# Patient Record
Sex: Male | Born: 1945
Health system: Southern US, Community
[De-identification: ages and names within clinical notes are randomized; demographics above are authoritative.]

## PROBLEM LIST (undated history)

## (undated) DIAGNOSIS — I10 Essential (primary) hypertension: Secondary | ICD-10-CM

## (undated) DIAGNOSIS — Z85828 Personal history of other malignant neoplasm of skin: Secondary | ICD-10-CM

## (undated) DIAGNOSIS — E119 Type 2 diabetes mellitus without complications: Secondary | ICD-10-CM

## (undated) DIAGNOSIS — K589 Irritable bowel syndrome without diarrhea: Secondary | ICD-10-CM

## (undated) DIAGNOSIS — K219 Gastro-esophageal reflux disease without esophagitis: Secondary | ICD-10-CM

## (undated) DIAGNOSIS — E785 Hyperlipidemia, unspecified: Secondary | ICD-10-CM

## (undated) HISTORY — DX: Irritable bowel syndrome, unspecified: K58.9

## (undated) HISTORY — DX: Gastro-esophageal reflux disease without esophagitis: K21.9

## (undated) HISTORY — DX: Essential (primary) hypertension: I10

## (undated) HISTORY — DX: Hyperlipidemia, unspecified: E78.5

## (undated) HISTORY — DX: Personal history of other malignant neoplasm of skin: Z85.828

## (undated) HISTORY — DX: Type 2 diabetes mellitus without complications: E11.9

---

## 2004-10-27 ENCOUNTER — Ambulatory Visit: Payer: Self-pay | Admitting: Family Medicine

## 2005-01-24 ENCOUNTER — Ambulatory Visit: Payer: Self-pay | Admitting: Family Medicine

## 2005-07-19 ENCOUNTER — Ambulatory Visit: Payer: Self-pay | Admitting: Family Medicine

## 2005-08-03 ENCOUNTER — Ambulatory Visit: Payer: Self-pay | Admitting: Family Medicine

## 2005-10-12 ENCOUNTER — Ambulatory Visit: Payer: Self-pay | Admitting: Family Medicine

## 2005-12-13 ENCOUNTER — Ambulatory Visit: Payer: Self-pay | Admitting: Family Medicine

## 2005-12-29 ENCOUNTER — Ambulatory Visit: Payer: Self-pay | Admitting: Family Medicine

## 2011-02-09 HISTORY — PX: ESOPHAGOGASTRODUODENOSCOPY: SHX1529

## 2015-12-24 DIAGNOSIS — E119 Type 2 diabetes mellitus without complications: Secondary | ICD-10-CM | POA: Diagnosis not present

## 2015-12-24 DIAGNOSIS — E784 Other hyperlipidemia: Secondary | ICD-10-CM | POA: Diagnosis not present

## 2015-12-24 DIAGNOSIS — I10 Essential (primary) hypertension: Secondary | ICD-10-CM | POA: Diagnosis not present

## 2016-01-05 DIAGNOSIS — Z1211 Encounter for screening for malignant neoplasm of colon: Secondary | ICD-10-CM | POA: Diagnosis not present

## 2016-01-05 DIAGNOSIS — G8929 Other chronic pain: Secondary | ICD-10-CM | POA: Diagnosis not present

## 2016-01-05 DIAGNOSIS — Z86010 Personal history of colonic polyps: Secondary | ICD-10-CM | POA: Diagnosis not present

## 2016-01-05 DIAGNOSIS — R1031 Right lower quadrant pain: Secondary | ICD-10-CM | POA: Diagnosis not present

## 2016-01-08 DIAGNOSIS — Z1211 Encounter for screening for malignant neoplasm of colon: Secondary | ICD-10-CM | POA: Diagnosis not present

## 2016-01-08 DIAGNOSIS — D124 Benign neoplasm of descending colon: Secondary | ICD-10-CM | POA: Diagnosis not present

## 2016-01-08 DIAGNOSIS — Z86010 Personal history of colonic polyps: Secondary | ICD-10-CM | POA: Diagnosis not present

## 2016-01-08 DIAGNOSIS — K648 Other hemorrhoids: Secondary | ICD-10-CM | POA: Diagnosis not present

## 2016-01-08 DIAGNOSIS — K5730 Diverticulosis of large intestine without perforation or abscess without bleeding: Secondary | ICD-10-CM | POA: Diagnosis not present

## 2016-01-08 HISTORY — PX: COLONOSCOPY: SHX174

## 2016-02-23 DIAGNOSIS — E782 Mixed hyperlipidemia: Secondary | ICD-10-CM | POA: Diagnosis not present

## 2016-03-24 DIAGNOSIS — I10 Essential (primary) hypertension: Secondary | ICD-10-CM | POA: Diagnosis not present

## 2016-03-24 DIAGNOSIS — E119 Type 2 diabetes mellitus without complications: Secondary | ICD-10-CM | POA: Diagnosis not present

## 2016-03-24 DIAGNOSIS — E782 Mixed hyperlipidemia: Secondary | ICD-10-CM | POA: Diagnosis not present

## 2016-04-06 DIAGNOSIS — E119 Type 2 diabetes mellitus without complications: Secondary | ICD-10-CM | POA: Diagnosis not present

## 2016-09-12 DIAGNOSIS — Z23 Encounter for immunization: Secondary | ICD-10-CM | POA: Diagnosis not present

## 2016-09-21 DIAGNOSIS — H2513 Age-related nuclear cataract, bilateral: Secondary | ICD-10-CM | POA: Diagnosis not present

## 2016-09-21 DIAGNOSIS — E119 Type 2 diabetes mellitus without complications: Secondary | ICD-10-CM | POA: Diagnosis not present

## 2016-09-21 DIAGNOSIS — H53002 Unspecified amblyopia, left eye: Secondary | ICD-10-CM | POA: Diagnosis not present

## 2016-09-29 DIAGNOSIS — E782 Mixed hyperlipidemia: Secondary | ICD-10-CM | POA: Diagnosis not present

## 2016-09-29 DIAGNOSIS — E119 Type 2 diabetes mellitus without complications: Secondary | ICD-10-CM | POA: Diagnosis not present

## 2016-09-29 DIAGNOSIS — R011 Cardiac murmur, unspecified: Secondary | ICD-10-CM | POA: Diagnosis not present

## 2016-09-29 DIAGNOSIS — I10 Essential (primary) hypertension: Secondary | ICD-10-CM | POA: Diagnosis not present

## 2016-11-23 DIAGNOSIS — J042 Acute laryngotracheitis: Secondary | ICD-10-CM | POA: Diagnosis not present

## 2017-02-02 DIAGNOSIS — M792 Neuralgia and neuritis, unspecified: Secondary | ICD-10-CM | POA: Diagnosis not present

## 2017-03-14 DIAGNOSIS — L570 Actinic keratosis: Secondary | ICD-10-CM | POA: Diagnosis not present

## 2017-03-14 DIAGNOSIS — L821 Other seborrheic keratosis: Secondary | ICD-10-CM | POA: Diagnosis not present

## 2017-03-14 DIAGNOSIS — L578 Other skin changes due to chronic exposure to nonionizing radiation: Secondary | ICD-10-CM | POA: Diagnosis not present

## 2017-04-03 DIAGNOSIS — E119 Type 2 diabetes mellitus without complications: Secondary | ICD-10-CM | POA: Diagnosis not present

## 2017-04-03 DIAGNOSIS — E782 Mixed hyperlipidemia: Secondary | ICD-10-CM | POA: Diagnosis not present

## 2017-04-03 DIAGNOSIS — Z7984 Long term (current) use of oral hypoglycemic drugs: Secondary | ICD-10-CM | POA: Diagnosis not present

## 2017-04-13 DIAGNOSIS — E119 Type 2 diabetes mellitus without complications: Secondary | ICD-10-CM | POA: Diagnosis not present

## 2017-04-13 DIAGNOSIS — I10 Essential (primary) hypertension: Secondary | ICD-10-CM | POA: Diagnosis not present

## 2017-04-13 DIAGNOSIS — E782 Mixed hyperlipidemia: Secondary | ICD-10-CM | POA: Diagnosis not present

## 2017-07-10 DIAGNOSIS — H25813 Combined forms of age-related cataract, bilateral: Secondary | ICD-10-CM | POA: Diagnosis not present

## 2017-07-10 DIAGNOSIS — E119 Type 2 diabetes mellitus without complications: Secondary | ICD-10-CM | POA: Diagnosis not present

## 2017-07-12 DIAGNOSIS — L570 Actinic keratosis: Secondary | ICD-10-CM | POA: Diagnosis not present

## 2017-07-12 DIAGNOSIS — L821 Other seborrheic keratosis: Secondary | ICD-10-CM | POA: Diagnosis not present

## 2017-07-14 DIAGNOSIS — E119 Type 2 diabetes mellitus without complications: Secondary | ICD-10-CM | POA: Diagnosis not present

## 2017-09-03 DIAGNOSIS — R739 Hyperglycemia, unspecified: Secondary | ICD-10-CM | POA: Diagnosis not present

## 2017-09-03 DIAGNOSIS — R531 Weakness: Secondary | ICD-10-CM | POA: Diagnosis not present

## 2017-09-03 DIAGNOSIS — E1165 Type 2 diabetes mellitus with hyperglycemia: Secondary | ICD-10-CM | POA: Diagnosis not present

## 2017-09-03 DIAGNOSIS — R42 Dizziness and giddiness: Secondary | ICD-10-CM | POA: Diagnosis not present

## 2017-09-08 DIAGNOSIS — F419 Anxiety disorder, unspecified: Secondary | ICD-10-CM | POA: Diagnosis not present

## 2017-09-08 DIAGNOSIS — Z23 Encounter for immunization: Secondary | ICD-10-CM | POA: Diagnosis not present

## 2017-10-19 DIAGNOSIS — E119 Type 2 diabetes mellitus without complications: Secondary | ICD-10-CM | POA: Diagnosis not present

## 2017-10-19 DIAGNOSIS — F419 Anxiety disorder, unspecified: Secondary | ICD-10-CM | POA: Diagnosis not present

## 2017-10-19 DIAGNOSIS — E782 Mixed hyperlipidemia: Secondary | ICD-10-CM | POA: Diagnosis not present

## 2017-10-19 DIAGNOSIS — I10 Essential (primary) hypertension: Secondary | ICD-10-CM | POA: Diagnosis not present

## 2017-11-01 DIAGNOSIS — I10 Essential (primary) hypertension: Secondary | ICD-10-CM | POA: Diagnosis not present

## 2017-11-01 DIAGNOSIS — E119 Type 2 diabetes mellitus without complications: Secondary | ICD-10-CM | POA: Diagnosis not present

## 2017-11-01 DIAGNOSIS — S39011A Strain of muscle, fascia and tendon of abdomen, initial encounter: Secondary | ICD-10-CM | POA: Diagnosis not present

## 2017-11-01 DIAGNOSIS — R1032 Left lower quadrant pain: Secondary | ICD-10-CM | POA: Diagnosis not present

## 2017-11-17 DIAGNOSIS — K582 Mixed irritable bowel syndrome: Secondary | ICD-10-CM | POA: Diagnosis not present

## 2017-11-21 DIAGNOSIS — A09 Infectious gastroenteritis and colitis, unspecified: Secondary | ICD-10-CM | POA: Diagnosis not present

## 2017-12-04 DIAGNOSIS — J042 Acute laryngotracheitis: Secondary | ICD-10-CM | POA: Diagnosis not present

## 2017-12-05 HISTORY — PX: HERNIA REPAIR: SHX51

## 2017-12-13 DIAGNOSIS — L821 Other seborrheic keratosis: Secondary | ICD-10-CM | POA: Diagnosis not present

## 2017-12-13 DIAGNOSIS — L570 Actinic keratosis: Secondary | ICD-10-CM | POA: Diagnosis not present

## 2017-12-13 DIAGNOSIS — L578 Other skin changes due to chronic exposure to nonionizing radiation: Secondary | ICD-10-CM | POA: Diagnosis not present

## 2017-12-20 DIAGNOSIS — E119 Type 2 diabetes mellitus without complications: Secondary | ICD-10-CM | POA: Diagnosis not present

## 2017-12-20 DIAGNOSIS — I10 Essential (primary) hypertension: Secondary | ICD-10-CM | POA: Diagnosis not present

## 2017-12-20 DIAGNOSIS — R1032 Left lower quadrant pain: Secondary | ICD-10-CM | POA: Diagnosis not present

## 2017-12-20 DIAGNOSIS — K4020 Bilateral inguinal hernia, without obstruction or gangrene, not specified as recurrent: Secondary | ICD-10-CM | POA: Diagnosis not present

## 2017-12-28 DIAGNOSIS — I493 Ventricular premature depolarization: Secondary | ICD-10-CM | POA: Diagnosis not present

## 2017-12-28 DIAGNOSIS — I4510 Unspecified right bundle-branch block: Secondary | ICD-10-CM | POA: Diagnosis not present

## 2017-12-28 DIAGNOSIS — Z01812 Encounter for preprocedural laboratory examination: Secondary | ICD-10-CM | POA: Diagnosis not present

## 2017-12-28 DIAGNOSIS — K4020 Bilateral inguinal hernia, without obstruction or gangrene, not specified as recurrent: Secondary | ICD-10-CM | POA: Diagnosis not present

## 2018-01-01 DIAGNOSIS — K4020 Bilateral inguinal hernia, without obstruction or gangrene, not specified as recurrent: Secondary | ICD-10-CM | POA: Diagnosis not present

## 2018-01-01 DIAGNOSIS — K4000 Bilateral inguinal hernia, with obstruction, without gangrene, not specified as recurrent: Secondary | ICD-10-CM | POA: Diagnosis not present

## 2018-01-01 DIAGNOSIS — E119 Type 2 diabetes mellitus without complications: Secondary | ICD-10-CM | POA: Diagnosis not present

## 2018-03-21 DIAGNOSIS — R1031 Right lower quadrant pain: Secondary | ICD-10-CM | POA: Diagnosis not present

## 2018-03-21 DIAGNOSIS — I10 Essential (primary) hypertension: Secondary | ICD-10-CM | POA: Diagnosis not present

## 2018-03-21 DIAGNOSIS — E119 Type 2 diabetes mellitus without complications: Secondary | ICD-10-CM | POA: Diagnosis not present

## 2018-04-25 DIAGNOSIS — E119 Type 2 diabetes mellitus without complications: Secondary | ICD-10-CM | POA: Diagnosis not present

## 2018-04-25 DIAGNOSIS — E782 Mixed hyperlipidemia: Secondary | ICD-10-CM | POA: Diagnosis not present

## 2018-04-25 DIAGNOSIS — F419 Anxiety disorder, unspecified: Secondary | ICD-10-CM | POA: Diagnosis not present

## 2018-04-25 DIAGNOSIS — I10 Essential (primary) hypertension: Secondary | ICD-10-CM | POA: Diagnosis not present

## 2018-04-25 DIAGNOSIS — Z0000 Encounter for general adult medical examination without abnormal findings: Secondary | ICD-10-CM | POA: Diagnosis not present

## 2018-04-25 DIAGNOSIS — Z125 Encounter for screening for malignant neoplasm of prostate: Secondary | ICD-10-CM | POA: Diagnosis not present

## 2018-07-17 ENCOUNTER — Encounter: Payer: Self-pay | Admitting: Gastroenterology

## 2018-08-20 ENCOUNTER — Encounter: Payer: Self-pay | Admitting: Gastroenterology

## 2018-08-21 ENCOUNTER — Ambulatory Visit (INDEPENDENT_AMBULATORY_CARE_PROVIDER_SITE_OTHER): Payer: PPO | Admitting: Gastroenterology

## 2018-08-21 ENCOUNTER — Encounter: Payer: Self-pay | Admitting: Gastroenterology

## 2018-08-21 VITALS — BP 138/82 | HR 82 | Ht 67.0 in | Wt 179.1 lb

## 2018-08-21 DIAGNOSIS — K582 Mixed irritable bowel syndrome: Secondary | ICD-10-CM

## 2018-08-21 DIAGNOSIS — R1011 Right upper quadrant pain: Secondary | ICD-10-CM | POA: Diagnosis not present

## 2018-08-21 NOTE — Patient Instructions (Signed)
If you are age 72 or older, your body mass index should be between 23-30. Your Body mass index is 28.05 kg/m. If this is out of the aforementioned range listed, please consider follow up with your Primary Care Provider.  If you are age 20 or younger, your body mass index should be between 19-25. Your Body mass index is 28.05 kg/m. If this is out of the aformentioned range listed, please consider follow up with your Primary Care Provider.    You have been scheduled for an abdominal ultrasound at [place] (1st floor of hospital) on 08/27/18 at 11am. Please arrive 15 minutes prior to your appointment for registration. Make certain not to have anything to eat or drink 6 hours prior to your appointment. Should you need to reschedule your appointment, please contact radiology at [phone]. This test typically takes about 30 minutes to perform.  Please purchase the following medications over the counter and take as directed: Benefiber  Stop taking Metamucil.   Thank you, Dr. Jackquline Denmark

## 2018-08-21 NOTE — Progress Notes (Signed)
Chief Complaint: FU  Referring Provider:  Dr Garlon Hatchet      ASSESSMENT AND PLAN;   #1. IBS with constipation (alt diarrhea), neg colon 01/2016 except for mild sigmoid diverticulosis, hyperplastic colonic polyp.  Repeat colonoscopy only if any new problems. -Switch metamucil to benefiber. -Increase water intake. -Patient is to call us if he still has any problems. -Can use Bentyl as needed if he has diarrhea.  #2.  RUQ pain, negative EGD 02/2011 with negative small bowel biopsies.  Status post recent robotic B/L inguinal hernia repair by Dr. Forestine Na. Nl labs in care everywhere. -Proceed with ultrasound of the abdomen complete. -CT scan of the abdomen and pelvis if the above work-up is negative and continued problems. -Patient will call us if he still has problems.  Follow-up if still with problems.  Discussed above with the patient's wife in detail as well.    HPI:    Jay Allen is a 72 y.o. male  S/p bilateral inguinal hernia repair jan 2019 Has done very well Has alternating constipation with occasional diarrhea And sticky stool.-With Metamucil At times will have diarrhea after eating dinner -once every 2 to 3 weeks. Has Bentyl but has not tried it. No melena or hematochezia Occasional right upper quadrant abdominal pain-could not specify as to if eating makes it worse.  No associated nausea, heartburn, regurgitation, odynophagia or dysphagia.  Has diabetes and is concerned about the liver and pancreas. No weight loss Please golf whenever he can.    Past Medical History:  Diagnosis Date  . Diabetes mellitus ([place])   . GERD (gastroesophageal reflux disease)   . HTN (hypertension)   . Hx of nonmelanoma skin cancer   . Hyperlipidemia   . IBS (irritable bowel syndrome)     Past Surgical History:  Procedure Laterality Date  . COLONOSCOPY  01/08/2016   Diminutive colon polyps, status post polypectomy.   . ESOPHAGOGASTRODUODENOSCOPY  02/09/2011   Mild gastritis.    Marland Kitchen HERNIA REPAIR  12/2017    History reviewed. No pertinent family history.  Social History   Tobacco Use  . Smoking status: Former Smoker    Last attempt to quit: 1982    Years since quitting: 37.7  . Smokeless tobacco: Never Used  Substance Use Topics  . Alcohol use: Yes    Alcohol/week: 4.0 standard drinks    Types: 4 Cans of beer per week    Frequency: Never  . Drug use: Never    Current Outpatient Medications  Medication Sig Dispense Refill  . atorvastatin (LIPITOR) 80 MG tablet Take 80 mg by mouth daily.    Marland Kitchen escitalopram (LEXAPRO) 10 MG tablet Take 10 mg by mouth daily.    . irbesartan-hydrochlorothiazide (AVALIDE) 300-12.5 MG tablet Take 1 tablet by mouth daily.     No current facility-administered medications for this visit.     Allergies  Allergen Reactions  . Amoxicillin Diarrhea  . Metformin Diarrhea    minor    Review of Systems:  Constitutional: Denies fever, chills, diaphoresis, appetite change and fatigue.  HEENT: Denies photophobia, eye pain, redness, hearing loss, ear pain, congestion, sore throat, rhinorrhea, sneezing, mouth sores, neck pain, neck stiffness and tinnitus.   Respiratory: Denies SOB, DOE, cough, chest tightness,  and wheezing.   Cardiovascular: Denies chest pain, palpitations and leg swelling.  Genitourinary: Denies dysuria, urgency, frequency, hematuria, flank pain and difficulty urinating.  Musculoskeletal: Denies myalgias, back pain, joint swelling, arthralgias and gait problem.  Skin: No rash.  Neurological: Denies dizziness, seizures, syncope, weakness, light-headedness, numbness and headaches.  Hematological: Denies adenopathy. Easy bruising, personal or family bleeding history  Psychiatric/Behavioral: No anxiety or depression     Physical Exam:    BP 138/82   Pulse 82   Ht 5\' 7"  (1.702 m)   Wt 179 lb 2 oz (81.3 kg)   BMI 28.05 kg/m  Filed Weights   08/21/18 1409  Weight: 179 lb 2 oz (81.3 kg)   Constitutional:   Well-developed, in no acute distress. Psychiatric: Normal mood and affect. Behavior is normal. HEENT: Pupils normal.  Conjunctivae are normal. No scleral icterus. Neck supple.  Cardiovascular: Normal rate, regular rhythm. No edema Pulmonary/chest: Effort normal and breath sounds normal. No wheezing, rales or rhonchi. Abdominal: Soft, nondistended. Nontender. Bowel sounds active throughout. There are no masses palpable. No hepatomegaly. Rectal:  defered Neurological: Alert and oriented to person place and time. Skin: Skin is warm and dry. No rashes noted.     Carmell Austria, MD 08/21/2018, 2:32 PM  Cc: Dr. Garlon Hatchet

## 2018-08-27 ENCOUNTER — Ambulatory Visit (HOSPITAL_BASED_OUTPATIENT_CLINIC_OR_DEPARTMENT_OTHER)
Admission: RE | Admit: 2018-08-27 | Discharge: 2018-08-27 | Disposition: A | Discharge status: Home / Self Care | Payer: PPO | Source: Ambulatory Visit | Attending: Gastroenterology | Admitting: Gastroenterology

## 2018-08-27 DIAGNOSIS — I700 Atherosclerosis of aorta: Secondary | ICD-10-CM | POA: Insufficient documentation

## 2018-08-27 DIAGNOSIS — R1011 Right upper quadrant pain: Secondary | ICD-10-CM

## 2018-08-27 DIAGNOSIS — N200 Calculus of kidney: Secondary | ICD-10-CM | POA: Insufficient documentation

## 2018-09-26 DIAGNOSIS — Z23 Encounter for immunization: Secondary | ICD-10-CM | POA: Diagnosis not present

## 2018-10-08 ENCOUNTER — Telehealth: Payer: Self-pay | Admitting: Gastroenterology

## 2018-10-08 NOTE — Telephone Encounter (Signed)
Discussed with pt that he can take the dicyclomine every 6 hours as needed, pts questions were answered.

## 2018-10-26 DIAGNOSIS — E119 Type 2 diabetes mellitus without complications: Secondary | ICD-10-CM | POA: Diagnosis not present

## 2018-10-26 DIAGNOSIS — F419 Anxiety disorder, unspecified: Secondary | ICD-10-CM | POA: Diagnosis not present

## 2018-10-26 DIAGNOSIS — E782 Mixed hyperlipidemia: Secondary | ICD-10-CM | POA: Diagnosis not present

## 2018-10-26 DIAGNOSIS — I700 Atherosclerosis of aorta: Secondary | ICD-10-CM | POA: Diagnosis not present

## 2018-10-26 DIAGNOSIS — I10 Essential (primary) hypertension: Secondary | ICD-10-CM | POA: Diagnosis not present

## 2018-10-26 DIAGNOSIS — Z125 Encounter for screening for malignant neoplasm of prostate: Secondary | ICD-10-CM | POA: Diagnosis not present

## 2018-12-02 ENCOUNTER — Other Ambulatory Visit: Payer: Self-pay | Admitting: Gastroenterology

## 2019-01-21 DIAGNOSIS — E119 Type 2 diabetes mellitus without complications: Secondary | ICD-10-CM | POA: Diagnosis not present

## 2019-01-21 DIAGNOSIS — H524 Presbyopia: Secondary | ICD-10-CM | POA: Diagnosis not present

## 2019-01-29 DIAGNOSIS — E782 Mixed hyperlipidemia: Secondary | ICD-10-CM | POA: Diagnosis not present

## 2019-03-02 ENCOUNTER — Other Ambulatory Visit: Payer: Self-pay | Admitting: Gastroenterology

## 2019-04-06 IMAGING — US US ABDOMEN COMPLETE
1 series · 13 of 25 positions shown · non-contrast
Comparison: 11/22/2013 CT. 12/15/2010 right upper quadrant
ultrasound.

CLINICAL DATA: 72-year-old male with intermittent right upper
quadrant pain over the past year. Initial encounter.

EXAM:
ABDOMEN ULTRASOUND COMPLETE

[Series 1: us abdomen complete · 0.13mm/px · 13 of 111 slices shown]
[im 1/111]
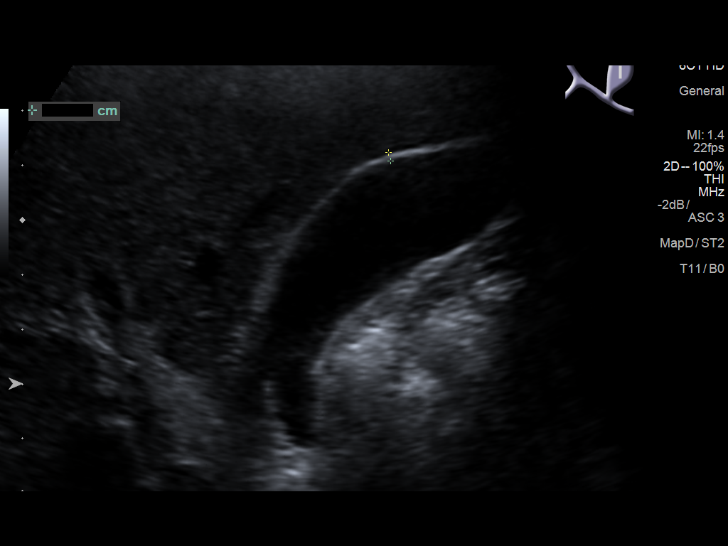
[im 10/111]
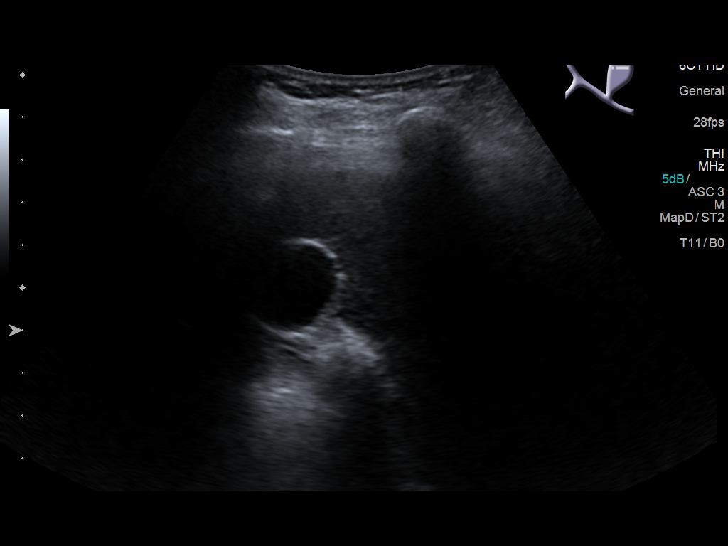
[im 19/111]
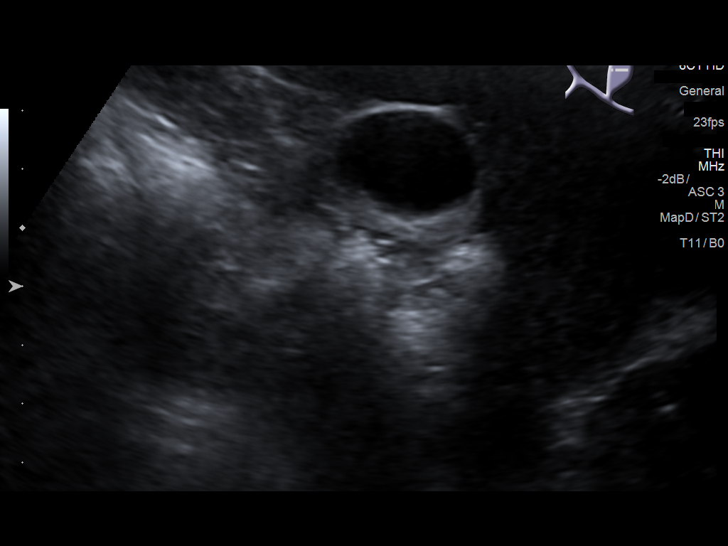
[im 28/111]
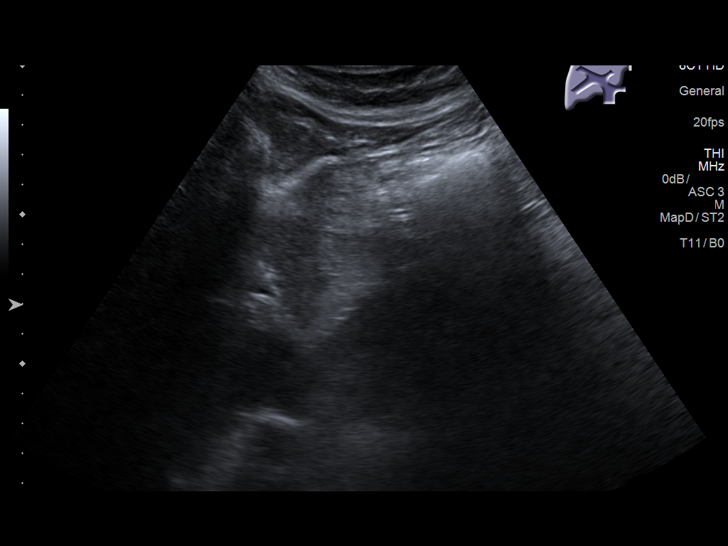
[im 37/111]
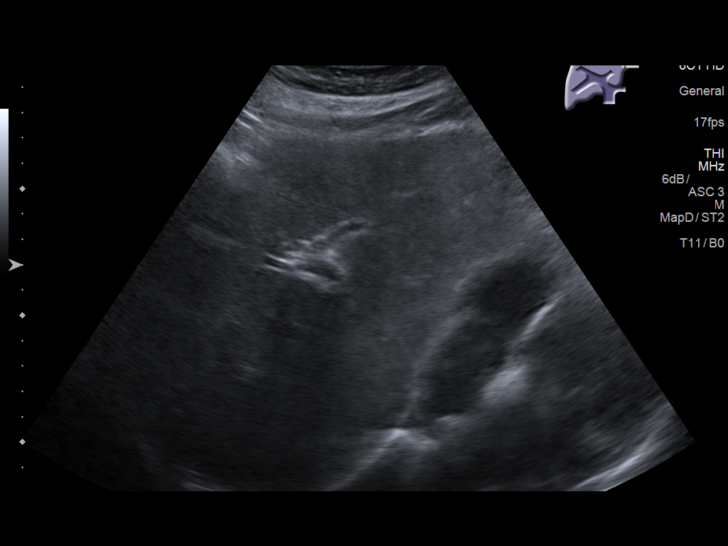
[im 46/111]
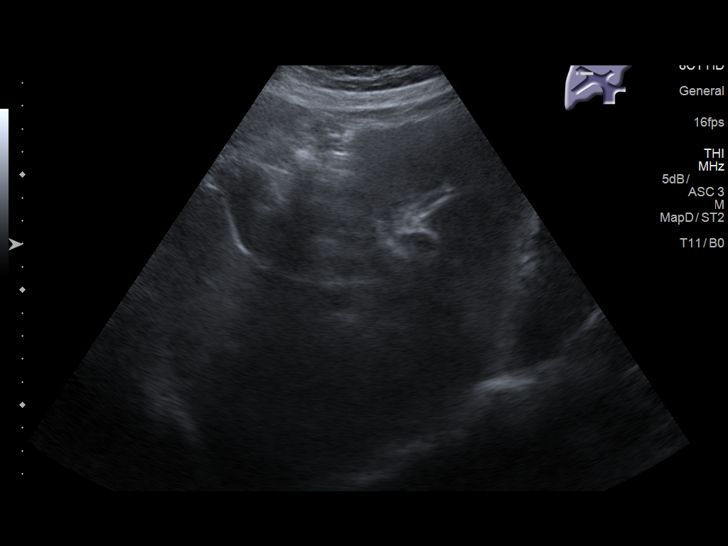
[im 56/111]
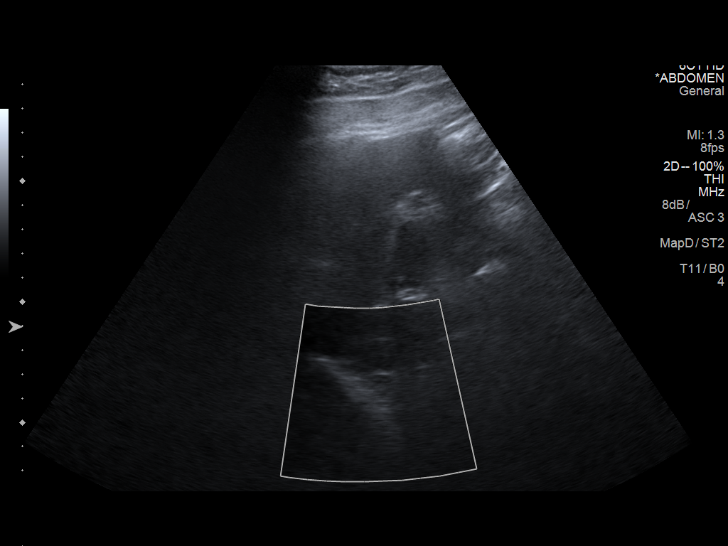
[im 65/111]
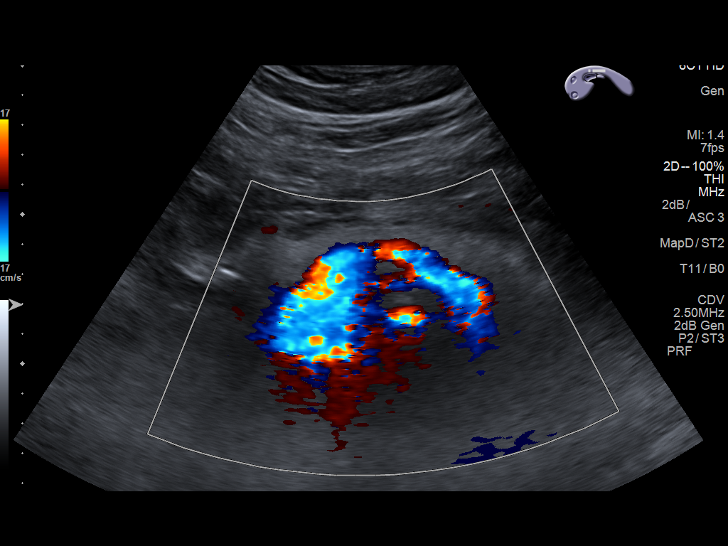
[im 74/111]
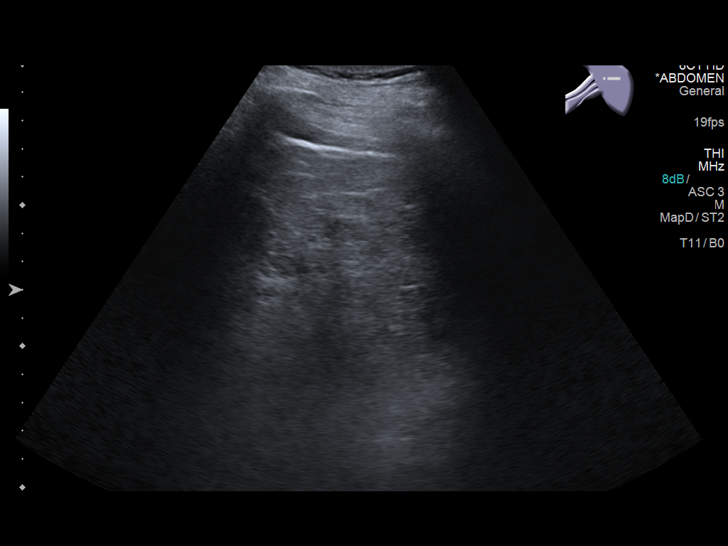
[im 83/111]
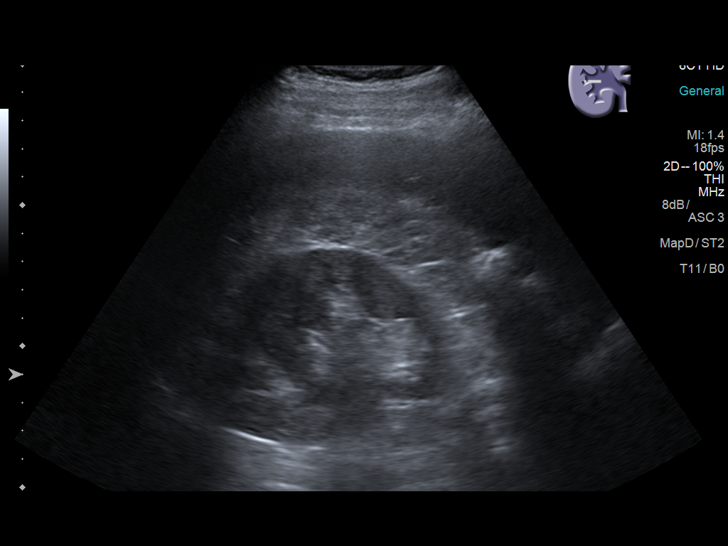
[im 92/111]
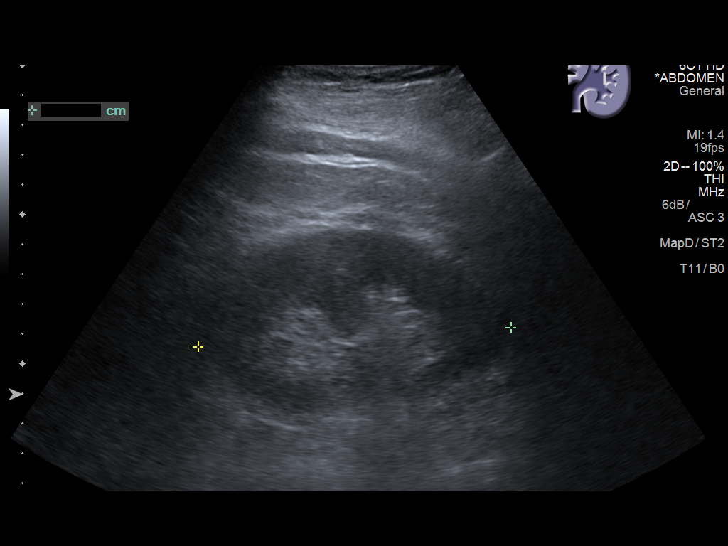
[im 101/111]
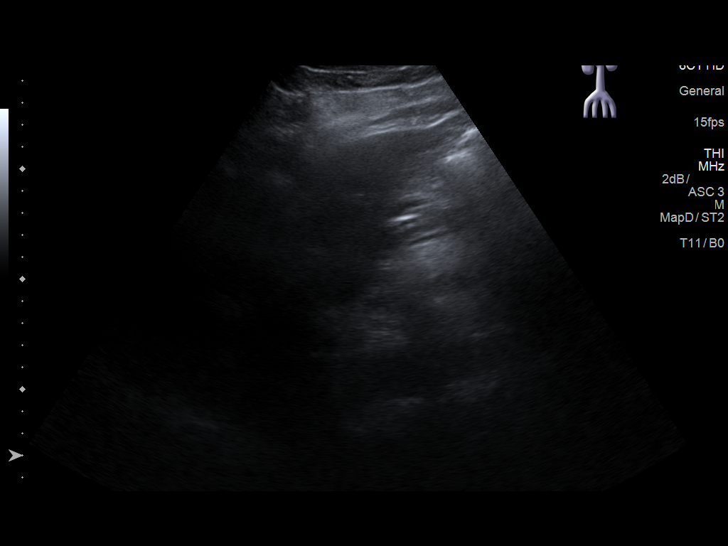
[im 111/111]
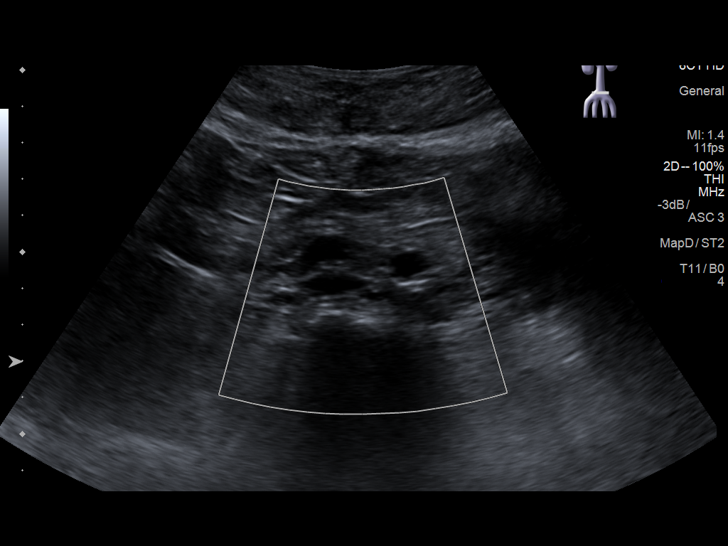

[13 of 25 positions shown; findings below may reference images not displayed]

FINDINGS: Gallbladder: No gallstones or wall thickening visualized. No
sonographic Murphy sign noted by sonographer.

Common bile duct: Diameter: 2.7 mm

Liver: No focal lesion identified. Within normal limits in
parenchymal echogenicity. Portal vein is patent on color Doppler
imaging with normal direction of blood flow towards the liver.

IVC: No abnormality visualized.

Pancreas: Visualized portion unremarkable.

Spleen: Size and appearance within normal limits.

Right Kidney: Length: 10.1 cm. Echogenicity within normal limits. No
hydronephrosis. 7 mm nonobstructing stone mid aspect.

Left Kidney: Length: 10.5 cm. Echogenicity within normal limits. No
mass or hydronephrosis visualized.

Abdominal aorta: Atherosclerotic changes.  No aneurysm noted.

Other findings: None.
IMPRESSION: 1. 7 mm nonobstructing right renal calculi.
2. Aortic Atherosclerosis ([id number].M). No abdominal aortic
aneurysm noted.
3. Remainder of exam within normal limits.

## 2019-05-16 DIAGNOSIS — F419 Anxiety disorder, unspecified: Secondary | ICD-10-CM | POA: Diagnosis not present

## 2019-05-16 DIAGNOSIS — I700 Atherosclerosis of aorta: Secondary | ICD-10-CM | POA: Diagnosis not present

## 2019-05-16 DIAGNOSIS — E782 Mixed hyperlipidemia: Secondary | ICD-10-CM | POA: Diagnosis not present

## 2019-05-16 DIAGNOSIS — E119 Type 2 diabetes mellitus without complications: Secondary | ICD-10-CM | POA: Diagnosis not present

## 2019-05-16 DIAGNOSIS — Z1211 Encounter for screening for malignant neoplasm of colon: Secondary | ICD-10-CM | POA: Diagnosis not present

## 2019-05-16 DIAGNOSIS — I10 Essential (primary) hypertension: Secondary | ICD-10-CM | POA: Diagnosis not present

## 2019-05-16 DIAGNOSIS — Z125 Encounter for screening for malignant neoplasm of prostate: Secondary | ICD-10-CM | POA: Diagnosis not present

## 2019-05-16 DIAGNOSIS — Z0000 Encounter for general adult medical examination without abnormal findings: Secondary | ICD-10-CM | POA: Diagnosis not present

## 2019-06-01 ENCOUNTER — Other Ambulatory Visit: Payer: Self-pay | Admitting: Gastroenterology

## 2019-08-19 DIAGNOSIS — E119 Type 2 diabetes mellitus without complications: Secondary | ICD-10-CM | POA: Diagnosis not present

## 2019-08-31 ENCOUNTER — Other Ambulatory Visit: Payer: Self-pay | Admitting: Gastroenterology

## 2019-10-17 DIAGNOSIS — Z23 Encounter for immunization: Secondary | ICD-10-CM | POA: Diagnosis not present

## 2019-11-19 DIAGNOSIS — F419 Anxiety disorder, unspecified: Secondary | ICD-10-CM | POA: Diagnosis not present

## 2019-11-19 DIAGNOSIS — I700 Atherosclerosis of aorta: Secondary | ICD-10-CM | POA: Diagnosis not present

## 2019-11-19 DIAGNOSIS — K588 Other irritable bowel syndrome: Secondary | ICD-10-CM | POA: Diagnosis not present

## 2019-11-19 DIAGNOSIS — E119 Type 2 diabetes mellitus without complications: Secondary | ICD-10-CM | POA: Diagnosis not present

## 2019-11-19 DIAGNOSIS — I10 Essential (primary) hypertension: Secondary | ICD-10-CM | POA: Diagnosis not present

## 2019-11-19 DIAGNOSIS — E782 Mixed hyperlipidemia: Secondary | ICD-10-CM | POA: Diagnosis not present

## 2020-05-06 DIAGNOSIS — E782 Mixed hyperlipidemia: Secondary | ICD-10-CM | POA: Diagnosis not present

## 2020-05-06 DIAGNOSIS — I700 Atherosclerosis of aorta: Secondary | ICD-10-CM | POA: Diagnosis not present

## 2020-05-06 DIAGNOSIS — Z0000 Encounter for general adult medical examination without abnormal findings: Secondary | ICD-10-CM | POA: Diagnosis not present

## 2020-05-06 DIAGNOSIS — I10 Essential (primary) hypertension: Secondary | ICD-10-CM | POA: Diagnosis not present

## 2020-05-06 DIAGNOSIS — E1165 Type 2 diabetes mellitus with hyperglycemia: Secondary | ICD-10-CM | POA: Diagnosis not present

## 2020-05-06 DIAGNOSIS — Z7982 Long term (current) use of aspirin: Secondary | ICD-10-CM | POA: Diagnosis not present

## 2020-05-06 DIAGNOSIS — E119 Type 2 diabetes mellitus without complications: Secondary | ICD-10-CM | POA: Diagnosis not present

## 2020-05-06 DIAGNOSIS — Z7984 Long term (current) use of oral hypoglycemic drugs: Secondary | ICD-10-CM | POA: Diagnosis not present

## 2020-05-06 DIAGNOSIS — Z79899 Other long term (current) drug therapy: Secondary | ICD-10-CM | POA: Diagnosis not present

## 2020-05-06 DIAGNOSIS — F419 Anxiety disorder, unspecified: Secondary | ICD-10-CM | POA: Diagnosis not present

## 2020-05-13 DIAGNOSIS — H524 Presbyopia: Secondary | ICD-10-CM | POA: Diagnosis not present

## 2020-05-13 DIAGNOSIS — E119 Type 2 diabetes mellitus without complications: Secondary | ICD-10-CM | POA: Diagnosis not present

## 2020-05-13 DIAGNOSIS — H25813 Combined forms of age-related cataract, bilateral: Secondary | ICD-10-CM | POA: Diagnosis not present

## 2020-05-13 DIAGNOSIS — H53002 Unspecified amblyopia, left eye: Secondary | ICD-10-CM | POA: Diagnosis not present

## 2020-11-09 DIAGNOSIS — I10 Essential (primary) hypertension: Secondary | ICD-10-CM | POA: Diagnosis not present

## 2020-11-09 DIAGNOSIS — Z125 Encounter for screening for malignant neoplasm of prostate: Secondary | ICD-10-CM | POA: Diagnosis not present

## 2020-11-09 DIAGNOSIS — E119 Type 2 diabetes mellitus without complications: Secondary | ICD-10-CM | POA: Diagnosis not present

## 2020-11-09 DIAGNOSIS — Z23 Encounter for immunization: Secondary | ICD-10-CM | POA: Diagnosis not present

## 2020-11-09 DIAGNOSIS — F419 Anxiety disorder, unspecified: Secondary | ICD-10-CM | POA: Diagnosis not present

## 2020-11-09 DIAGNOSIS — E782 Mixed hyperlipidemia: Secondary | ICD-10-CM | POA: Diagnosis not present

## 2020-11-13 DIAGNOSIS — J042 Acute laryngotracheitis: Secondary | ICD-10-CM | POA: Diagnosis not present
